# Patient Record
Sex: Female | Born: 1960 | Race: White | Hispanic: No | Marital: Married | Smoking: Never smoker
Health system: Southern US, Community
[De-identification: ages and names within clinical notes are randomized; demographics above are authoritative.]

## PROBLEM LIST (undated history)

## (undated) DIAGNOSIS — E119 Type 2 diabetes mellitus without complications: Secondary | ICD-10-CM

## (undated) DIAGNOSIS — I10 Essential (primary) hypertension: Secondary | ICD-10-CM

## (undated) DIAGNOSIS — M797 Fibromyalgia: Secondary | ICD-10-CM

## (undated) HISTORY — PX: NASAL SINUS SURGERY: SHX719

## (undated) HISTORY — PX: ABDOMINAL HYSTERECTOMY: SHX81

## (undated) HISTORY — PX: SHOULDER SURGERY: SHX246

---

## 2007-05-05 ENCOUNTER — Ambulatory Visit: Payer: Self-pay | Admitting: Internal Medicine

## 2007-06-10 ENCOUNTER — Ambulatory Visit: Payer: Self-pay | Admitting: Internal Medicine

## 2015-12-24 ENCOUNTER — Encounter: Payer: Self-pay | Admitting: *Deleted

## 2015-12-24 ENCOUNTER — Ambulatory Visit (INDEPENDENT_AMBULATORY_CARE_PROVIDER_SITE_OTHER): Payer: BLUE CROSS/BLUE SHIELD

## 2015-12-24 ENCOUNTER — Ambulatory Visit
Admission: EM | Admit: 2015-12-24 | Discharge: 2015-12-24 | Disposition: A | Discharge status: Home / Self Care | Payer: BLUE CROSS/BLUE SHIELD | Attending: Family Medicine | Admitting: Family Medicine

## 2015-12-24 DIAGNOSIS — J209 Acute bronchitis, unspecified: Secondary | ICD-10-CM

## 2015-12-24 DIAGNOSIS — J0101 Acute recurrent maxillary sinusitis: Secondary | ICD-10-CM | POA: Diagnosis not present

## 2015-12-24 DIAGNOSIS — H6593 Unspecified nonsuppurative otitis media, bilateral: Secondary | ICD-10-CM

## 2015-12-24 DIAGNOSIS — E860 Dehydration: Secondary | ICD-10-CM | POA: Diagnosis not present

## 2015-12-24 HISTORY — DX: Fibromyalgia: M79.7

## 2015-12-24 HISTORY — DX: Type 2 diabetes mellitus without complications: E11.9

## 2015-12-24 HISTORY — DX: Essential (primary) hypertension: I10

## 2015-12-24 MED ORDER — AMOXICILLIN-POT CLAVULANATE 875-125 MG PO TABS: 1.0000 | ORAL_TABLET | Freq: Two times a day (BID) | ORAL | Status: AC

## 2015-12-24 MED ORDER — IPRATROPIUM-ALBUTEROL 0.5-2.5 (3) MG/3ML IN SOLN: 3.0000 mL | Freq: Once | RESPIRATORY_TRACT | Status: DC

## 2015-12-24 MED ORDER — SALINE SPRAY 0.65 % NA SOLN: 2.0000 | NASAL | Status: AC

## 2015-12-24 MED ORDER — BENZONATATE 200 MG PO CAPS: 200.0000 mg | ORAL_CAPSULE | Freq: Three times a day (TID) | ORAL | Status: AC | PRN

## 2015-12-24 MED ORDER — ACETAMINOPHEN 500 MG PO TABS: 1000.0000 mg | ORAL_TABLET | Freq: Four times a day (QID) | ORAL | Status: AC | PRN

## 2015-12-24 MED ORDER — IPRATROPIUM-ALBUTEROL 0.5-2.5 (3) MG/3ML IN SOLN: 3.0000 mL | Freq: Four times a day (QID) | RESPIRATORY_TRACT | Status: DC

## 2015-12-24 MED ORDER — FLUTICASONE PROPIONATE 50 MCG/ACT NA SUSP
1.0000 | Freq: Two times a day (BID) | NASAL | Status: AC
Start: 2015-12-24 — End: ?

## 2015-12-24 MED ORDER — ALBUTEROL SULFATE HFA 108 (90 BASE) MCG/ACT IN AERS: 1.0000 | INHALATION_SPRAY | Freq: Four times a day (QID) | RESPIRATORY_TRACT | Status: AC | PRN

## 2015-12-24 NOTE — Discharge Instructions (Signed)
Acute Bronchitis °Bronchitis is inflammation of the airways that extend from the windpipe into the lungs (bronchi). The inflammation often causes mucus to develop. This leads to a cough, which is the most common symptom of bronchitis.  °In acute bronchitis, the condition usually develops suddenly and goes away over time, usually in a couple weeks. Smoking, allergies, and asthma can make bronchitis worse. Repeated episodes of bronchitis may cause further lung problems.  °CAUSES °Acute bronchitis is most often caused by the same virus that causes a cold. The virus can spread from person to person (contagious) through coughing, sneezing, and touching contaminated objects. °SIGNS AND SYMPTOMS  °· Cough.   °· Fever.   °· Coughing up mucus.   °· Body aches.   °· Chest congestion.   °· Chills.   °· Shortness of breath.   °· Sore throat.   °DIAGNOSIS  °Acute bronchitis is usually diagnosed through a physical exam. Your health care provider will also ask you questions about your medical history. Tests, such as chest X-rays, are sometimes done to rule out other conditions.  °TREATMENT  °Acute bronchitis usually goes away in a couple weeks. Oftentimes, no medical treatment is necessary. Medicines are sometimes given for relief of fever or cough. Antibiotic medicines are usually not needed but may be prescribed in certain situations. In some cases, an inhaler may be recommended to help reduce shortness of breath and control the cough. A cool mist vaporizer may also be used to help thin bronchial secretions and make it easier to clear the chest.  °HOME CARE INSTRUCTIONS °· Get plenty of rest.   °· Drink enough fluids to keep your urine clear or pale yellow (unless you have a medical condition that requires fluid restriction). Increasing fluids may help thin your respiratory secretions (sputum) and reduce chest congestion, and it will prevent dehydration.   °· Take medicines only as directed by your health care provider. °· If  you were prescribed an antibiotic medicine, finish it all even if you start to feel better. °· Avoid smoking and secondhand smoke. Exposure to cigarette smoke or irritating chemicals will make bronchitis worse. If you are a smoker, consider using nicotine gum or skin patches to help control withdrawal symptoms. Quitting smoking will help your lungs heal faster.   °· Reduce the chances of another bout of acute bronchitis by washing your hands frequently, avoiding people with cold symptoms, and trying not to touch your hands to your mouth, nose, or eyes.   °· Keep all follow-up visits as directed by your health care provider.   °SEEK MEDICAL CARE IF: °Your symptoms do not improve after 1 week of treatment.  °SEEK IMMEDIATE MEDICAL CARE IF: °· You develop an increased fever or chills.   °· You have chest pain.   °· You have severe shortness of breath. °· You have bloody sputum.   °· You develop dehydration. °· You faint or repeatedly feel like you are going to pass out. °· You develop repeated vomiting. °· You develop a severe headache. °MAKE SURE YOU:  °· Understand these instructions. °· Will watch your condition. °· Will get help right away if you are not doing well or get worse. °  °This information is not intended to replace advice given to you by your health care provider. Make sure you discuss any questions you have with your health care provider. °  °Document Released: 12/19/2004 Document Revised: 12/02/2014 Document Reviewed: 05/04/2013 °Elsevier Interactive Patient Education ©2016 [employer] °Otitis Media With Effusion °Otitis media with effusion is the presence of fluid in the middle ear. This is   a common problem in children, which often follows ear infections. It may be present for weeks or longer after the infection. Unlike an acute ear infection, otitis media with effusion refers only to fluid behind the ear drum and not infection. Children with repeated ear and sinus infections and allergy problems  are the most likely to get otitis media with effusion. CAUSES  The most frequent cause of the fluid buildup is dysfunction of the eustachian tubes. These are the tubes that drain fluid in the ears to the back of the nose (nasopharynx). SYMPTOMS   The main symptom of this condition is hearing loss. As a result, you or your child may:  Listen to the TV at a loud volume.  Not respond to questions.  Ask "what" often when spoken to.  Mistake or confuse one sound or word for another.  There may be a sensation of fullness or pressure but usually not pain. DIAGNOSIS   Your health care provider will diagnose this condition by examining you or your child's ears.  Your health care provider may test the pressure in you or your child's ear with a tympanometer.  A hearing test may be conducted if the problem persists. TREATMENT   Treatment depends on the duration and the effects of the effusion.  Antibiotics, decongestants, nose drops, and cortisone-type drugs (tablets or nasal spray) may not be helpful.  Children with persistent ear effusions may have delayed language or behavioral problems. Children at risk for developmental delays in hearing, learning, and speech may require referral to a specialist earlier than children not at risk.  You or your child's health care provider may suggest a referral to an ear, nose, and throat surgeon for treatment. The following may help restore normal hearing:  Drainage of fluid.  Placement of ear tubes (tympanostomy tubes).  Removal of adenoids (adenoidectomy). HOME CARE INSTRUCTIONS   Avoid secondhand smoke.  Infants who are breastfed are less likely to have this condition.  Avoid feeding infants while they are lying flat.  Avoid known environmental allergens.  Avoid people who are sick. SEEK MEDICAL CARE IF:   Hearing is not better in 3 months.  Hearing is worse.  Ear pain.  Drainage from the ear.  Dizziness. MAKE SURE YOU:    Understand these instructions.  Will watch your condition.  Will get help right away if you are not doing well or get worse.   This information is not intended to replace advice given to you by your health care provider. Make sure you discuss any questions you have with your health care provider.   Document Released: 12/19/2004 Document Revised: 12/02/2014 Document Reviewed: 06/08/2013 Elsevier Interactive Patient Education 2016 [employer] Sinusitis, Adult Sinusitis is redness, soreness, and inflammation of the paranasal sinuses. Paranasal sinuses are air pockets within the bones of your face. They are located beneath your eyes, in the middle of your forehead, and above your eyes. In healthy paranasal sinuses, mucus is able to drain out, and air is able to circulate through them by way of your nose. However, when your paranasal sinuses are inflamed, mucus and air can become trapped. This can allow bacteria and other germs to grow and cause infection. Sinusitis can develop quickly and last only a short time (acute) or continue over a long period (chronic). Sinusitis that lasts for more than 12 weeks is considered chronic. CAUSES Causes of sinusitis include:  Allergies.  Structural abnormalities, such as displacement of the cartilage that separates your nostrils (deviated septum),  which can decrease the air flow through your nose and sinuses and affect sinus drainage.  Functional abnormalities, such as when the small hairs (cilia) that line your sinuses and help remove mucus do not work properly or are not present. SIGNS AND SYMPTOMS Symptoms of acute and chronic sinusitis are the same. The primary symptoms are pain and pressure around the affected sinuses. Other symptoms include:  Upper toothache.  Earache.  Headache.  Bad breath.  Decreased sense of smell and taste.  A cough, which worsens when you are lying flat.  Fatigue.  Fever.  Thick drainage from your nose, which  often is green and may contain pus (purulent).  Swelling and warmth over the affected sinuses. DIAGNOSIS Your health care provider will perform a physical exam. During your exam, your health care provider may perform any of the following to help determine if you have acute sinusitis or chronic sinusitis:  Look in your nose for signs of abnormal growths in your nostrils (nasal polyps).  Tap over the affected sinus to check for signs of infection.  View the inside of your sinuses using an imaging device that has a light attached (endoscope). If your health care provider suspects that you have chronic sinusitis, one or more of the following tests may be recommended:  Allergy tests.  Nasal culture. A sample of mucus is taken from your nose, sent to a lab, and screened for bacteria.  Nasal cytology. A sample of mucus is taken from your nose and examined by your health care provider to determine if your sinusitis is related to an allergy. TREATMENT Most cases of acute sinusitis are related to a viral infection and will resolve on their own within 10 days. Sometimes, medicines are prescribed to help relieve symptoms of both acute and chronic sinusitis. These may include pain medicines, decongestants, nasal steroid sprays, or saline sprays. However, for sinusitis related to a bacterial infection, your health care provider will prescribe antibiotic medicines. These are medicines that will help kill the bacteria causing the infection. Rarely, sinusitis is caused by a fungal infection. In these cases, your health care provider will prescribe antifungal medicine. For some cases of chronic sinusitis, surgery is needed. Generally, these are cases in which sinusitis recurs more than 3 times per year, despite other treatments. HOME CARE INSTRUCTIONS  Drink plenty of water. Water helps thin the mucus so your sinuses can drain more easily.  Use a humidifier.  Inhale steam 3-4 times a day (for example, sit  in the bathroom with the shower running).  Apply a warm, moist washcloth to your face 3-4 times a day, or as directed by your health care provider.  Use saline nasal sprays to help moisten and clean your sinuses.  Take medicines only as directed by your health care provider.  If you were prescribed either an antibiotic or antifungal medicine, finish it all even if you start to feel better. SEEK IMMEDIATE MEDICAL CARE IF:  You have increasing pain or severe headaches.  You have nausea, vomiting, or drowsiness.  You have swelling around your face.  You have vision problems.  You have a stiff neck.  You have difficulty breathing.   This information is not intended to replace advice given to you by your health care provider. Make sure you discuss any questions you have with your health care provider.   Document Released: 11/11/2005 Document Revised: 12/02/2014 Document Reviewed: 11/26/2011 Elsevier Interactive Patient Education 2016 [employer] Dehydration, Adult Dehydration is a condition in which  you do not have enough fluid or water in your body. It happens when you take in less fluid than you lose. Vital organs such as the kidneys, brain, and heart cannot function without a proper amount of fluids. Any loss of fluids from the body can cause dehydration.  Dehydration can range from mild to severe. This condition should be treated right away to help prevent it from becoming severe. CAUSES  This condition may be caused by:  Vomiting.  Diarrhea.  Excessive sweating, such as when exercising in hot or humid weather.  Not drinking enough fluid during strenuous exercise or during an illness.  Excessive urine output.  Fever.  Certain medicines. RISK FACTORS This condition is more likely to develop in:  People who are taking certain medicines that cause the body to lose excess fluid (diuretics).   People who have a chronic illness, such as diabetes, that may increase  urination.  Older adults.   People who live at high altitudes.   People who participate in endurance sports.  SYMPTOMS  Mild Dehydration  Thirst.  Dry lips.  Slightly dry mouth.  Dry, warm skin. Moderate Dehydration  Very dry mouth.   Muscle cramps.   Dark urine and decreased urine production.   Decreased tear production.   Headache.   Light-headedness, especially when you stand up from a sitting position.  Severe Dehydration  Changes in skin.   Cold and clammy skin.   Skin does not spring back quickly when lightly pinched and released.   Changes in body fluids.   Extreme thirst.   No tears.   Not able to sweat when body temperature is high, such as in hot weather.   Minimal urine production.   Changes in vital signs.   Rapid, weak pulse (more than 100 beats per minute when you are sitting still).   Rapid breathing.   Low blood pressure.   Other changes.   Sunken eyes.   Cold hands and feet.   Confusion.  Lethargy and difficulty being awakened.  Fainting (syncope).   Short-term weight loss.   Unconsciousness. DIAGNOSIS  This condition may be diagnosed based on your symptoms. You may also have tests to determine how severe your dehydration is. These tests may include:   Urine tests.   Blood tests.  TREATMENT  Treatment for this condition depends on the severity. Mild or moderate dehydration can often be treated at home. Treatment should be started right away. Do not wait until dehydration becomes severe. Severe dehydration needs to be treated at the hospital. Treatment for Mild Dehydration  Drinking plenty of water to replace the fluid you have lost.   Replacing minerals in your blood (electrolytes) that you may have lost.  Treatment for Moderate Dehydration  Consuming oral rehydration solution (ORS). Treatment for Severe Dehydration  Receiving fluid through an IV tube.   Receiving electrolyte  solution through a feeding tube that is passed through your nose and into your stomach (nasogastric tube or NG tube).  Correcting any abnormalities in electrolytes. HOME CARE INSTRUCTIONS   Drink enough fluid to keep your urine clear or pale yellow.   Drink water or fluid slowly by taking small sips. You can also try sucking on ice cubes.  Have food or beverages that contain electrolytes. Examples include bananas and sports drinks.  Take over-the-counter and prescription medicines only as told by your health care provider.   Prepare ORS according to the manufacturer's instructions. Take sips of ORS every 5 minutes until your  urine returns to normal.  If you have vomiting or diarrhea, continue to try to drink water, ORS, or both.   If you have diarrhea, avoid:   Beverages that contain caffeine.   Fruit juice.   Milk.   Carbonated soft drinks.  Do not take salt tablets. This can lead to the condition of having too much sodium in your body (hypernatremia).  SEEK MEDICAL CARE IF:  You cannot eat or drink without vomiting.  You have had moderate diarrhea during a period of more than 24 hours.  You have a fever. SEEK IMMEDIATE MEDICAL CARE IF:   You have extreme thirst.  You have severe diarrhea.  You have not urinated in 6-8 hours, or you have urinated only a small amount of very dark urine.  You have shriveled skin.  You are dizzy, confused, or both.   This information is not intended to replace advice given to you by your health care provider. Make sure you discuss any questions you have with your health care provider.   Document Released: 11/11/2005 Document Revised: 08/02/2015 Document Reviewed: 03/29/2015 [employer] Interactive Patient Education [employer]

## 2015-12-24 NOTE — ED Notes (Signed)
Pt states that she has a cough and fever that started on Wednesday

## 2015-12-24 NOTE — ED Provider Notes (Signed)
CSN: [id number]     Arrival date & time 12/24/15  1048 History   First MD Initiated Contact with Patient 12/24/15 1137     Chief Complaint  Patient presents with  . Cough  . Fever   (Consider location/radiation/quality/duration/timing/severity/associated sxs/prior Treatment) HPI Comments: Married caucasian female here for evaluation of productive cough brown shortness of breath that started 5 days ago.  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  Diabetic uncontrolled working with endocrinology to improve HgbA1c last 10.5.  Today FBS 257 her usual per patient. T max 102 yesterday has taken nyquil with tylenol and advil po prn.  Patient reported she has not been taking all of her chronic medications due to not feeling well.  Decreased appetite/po intake.  FHX - F-Lymphoma, HTN, CAD  The history is provided by the patient and the spouse.    Past Medical History  Diagnosis Date  . Diabetes mellitus without complication (HCC)   . Hypertension   . Fibromyalgia    Past Surgical History  Procedure Laterality Date  . Abdominal hysterectomy    . Shoulder surgery    . Nasal sinus surgery     History reviewed. No pertinent family history. Social History  Substance Use Topics  . Smoking status: Never Smoker   . Smokeless tobacco: None  . Alcohol Use: No   OB History    No data available     Review of Systems  Constitutional: Positive for fatigue. Negative for fever, chills, diaphoresis, activity change, appetite change and unexpected weight change.  HENT: Positive for congestion, ear pain, postnasal drip and sinus pressure. Negative for dental problem, drooling, ear discharge, facial swelling, hearing loss, mouth sores, nosebleeds, rhinorrhea, sneezing, sore throat, tinnitus, trouble swallowing and voice change.   Eyes: Negative for photophobia, pain, discharge, redness, itching and visual disturbance.  Respiratory: Positive for cough, shortness of breath and wheezing.  Negative for choking, chest tightness and stridor.   Cardiovascular: Negative for chest pain, palpitations and leg swelling.  Gastrointestinal: Negative for nausea, vomiting, abdominal pain, diarrhea, constipation, blood in stool and abdominal distention.  Endocrine: Negative for cold intolerance and heat intolerance.  Genitourinary: Negative for dysuria, hematuria and difficulty urinating.  Musculoskeletal: Negative for myalgias, back pain, joint swelling, arthralgias, gait problem, neck pain and neck stiffness.  Skin: Negative for color change, pallor, rash and wound.  Allergic/Immunologic: Positive for environmental allergies. Negative for food allergies.  Neurological: Positive for headaches. Negative for dizziness, tremors, seizures, syncope, facial asymmetry, speech difficulty, weakness, light-headedness and numbness.  Hematological: Negative for adenopathy. Does not bruise/bleed easily.  Psychiatric/Behavioral: Positive for sleep disturbance. Negative for behavioral problems, confusion and agitation.    Allergies  Azithromycin; Dilaudid; and Strawberry (diagnostic)  Home Medications   Prior to Admission medications   Medication Sig Start Date End Date Taking? Authorizing Provider  amLODipine-benazepril (LOTREL) 10-40 MG capsule Take 1 capsule by mouth daily.   Yes Historical Provider, MD  aspirin 81 MG tablet Take 81 mg by mouth daily.   Yes Historical Provider, MD  atorvastatin (LIPITOR) 80 MG tablet Take 80 mg by mouth daily.   Yes Historical Provider, MD  carvedilol (COREG) 25 MG tablet Take 25 mg by mouth 2 (two) times daily with a meal.   Yes Historical Provider, MD  insulin aspart (NOVOLOG FLEXPEN) 100 UNIT/ML FlexPen Inject 20 Units into the skin 3 (three) times daily with meals.   Yes Historical Provider, MD  insulin detemir (LEVEMIR) 100 UNIT/ML injection Inject 60 Units into  the skin daily.   Yes Historical Provider, MD  metFORMIN (GLUCOPHAGE) 1000 MG tablet Take 2,000 mg  by mouth once.   Yes Historical Provider, MD  rOPINIRole (REQUIP) 4 MG tablet Take 4 mg by mouth 2 (two) times daily.   Yes Historical Provider, MD  torsemide (DEMADEX) 10 MG tablet Take 10 mg by mouth daily.   Yes Historical Provider, MD  acetaminophen (TYLENOL) 500 MG tablet Take 2 tablets (1,000 mg total) by mouth every 6 (six) hours as needed for mild pain, moderate pain, fever or headache. 12/24/15   Barbaraann Barthel, NP  albuterol (PROVENTIL HFA;VENTOLIN HFA) 108 (90 Base) MCG/ACT inhaler Inhale 1-2 puffs into the lungs every 6 (six) hours as needed for wheezing or shortness of breath. 12/24/15 12/30/15  Barbaraann Barthel, NP  amoxicillin-clavulanate (AUGMENTIN) 875-125 MG tablet Take 1 tablet by mouth every 12 (twelve) hours. 12/24/15   Barbaraann Barthel, NP  benzonatate (TESSALON) 200 MG capsule Take 1 capsule (200 mg total) by mouth 3 (three) times daily as needed for cough. 12/24/15 01/03/16  Barbaraann Barthel, NP  fluticasone (FLONASE) 50 MCG/ACT nasal spray Place 1 spray into both nostrils 2 (two) times daily. 12/24/15   Barbaraann Barthel, NP  sodium chloride (OCEAN) 0.65 % SOLN nasal spray Place 2 sprays into both nostrils every 2 (two) hours while awake. 12/24/15   Barbaraann Barthel, NP   Meds Ordered and Administered this Visit   Medications  ipratropium-albuterol (DUONEB) 0.5-2.5 (3) MG/3ML nebulizer solution 3 mL (not administered)    BP 167/91 mmHg  Pulse 88  Temp(Src) 100.1 F (37.8 C) (Oral)  Ht  (1.676 m)  Wt 233 lb (105.688 kg)  BMI 37.63 kg/m2  SpO2 95% No data found.   Physical Exam  Constitutional: She is oriented to person, place, and time. She appears well-developed and well-nourished. She is active and cooperative.  Non-toxic appearance. She does not have a sickly appearance. She appears ill. No distress.  HENT:  Head: Normocephalic and atraumatic.  Right Ear: Hearing, external ear and ear canal normal. Tympanic membrane is injected. A middle ear effusion is  present.  Left Ear: Hearing, external ear and ear canal normal. Tympanic membrane is injected. A middle ear effusion is present.  Nose: Mucosal edema and rhinorrhea present. No nose lacerations, sinus tenderness, nasal deformity, septal deviation or nasal septal hematoma. No epistaxis.  No foreign bodies. Right sinus exhibits maxillary sinus tenderness and frontal sinus tenderness. Left sinus exhibits maxillary sinus tenderness and frontal sinus tenderness.  Mouth/Throat: Uvula is midline and mucous membranes are normal. Mucous membranes are not pale, not dry and not cyanotic. She does not have dentures. No oral lesions. No trismus in the jaw. Normal dentition. No dental abscesses, uvula swelling, lacerations or dental caries. Posterior oropharyngeal edema and posterior oropharyngeal erythema present. No oropharyngeal exudate or tonsillar abscesses.  Cobblestoning posterior pharynx; bilateral TMs with air fluid level central TM injected bilaterally; bilateral nares with edema erythema yellow discharge clear;   Eyes: Conjunctivae, EOM and lids are normal. Pupils are equal, round, and reactive to light. Right eye exhibits no chemosis, no discharge, no exudate and no hordeolum. No foreign body present in the right eye. Left eye exhibits no chemosis, no discharge, no exudate and no hordeolum. No foreign body present in the left eye. Right conjunctiva is not injected. Right conjunctiva has no hemorrhage. Left conjunctiva is not injected. Left conjunctiva has no hemorrhage. No scleral icterus. Right eye exhibits normal extraocular motion  and no nystagmus. Left eye exhibits normal extraocular motion and no nystagmus. Right pupil is round and reactive. Left pupil is round and reactive. Pupils are equal.  Neck: Trachea normal and normal range of motion. Neck supple. No tracheal tenderness, no spinous process tenderness and no muscular tenderness present. No rigidity. No tracheal deviation, no edema, no erythema and  normal range of motion present. No thyroid mass and no thyromegaly present.  Cardiovascular: Normal rate, regular rhythm, S1 normal, S2 normal, normal heart sounds and intact distal pulses.  PMI is not displaced.  Exam reveals no gallop and no friction rub.   No murmur heard. Pulmonary/Chest: Effort normal. No accessory muscle usage or stridor. No respiratory distress. She has decreased breath sounds in the right lower field and the left lower field. She has wheezes in the right middle field and the left middle field. She has no rhonchi. She has no rales. She exhibits no tenderness.  Expiratory wheeze fine on auscultation bilaterally; decreased breath sounds bilateral lower fields; sob with speaking sentences  Abdominal: Soft. She exhibits no shifting dullness, no distension, no pulsatile liver, no fluid wave, no abdominal bruit, no ascites, no pulsatile midline mass and no mass. Bowel sounds are decreased. There is no hepatosplenomegaly. There is no tenderness. There is no rigidity, no rebound, no guarding, no CVA tenderness, no tenderness at McBurney's point and negative Murphy's sign. Hernia confirmed negative in the ventral area.  Dull to percussion x 4 quads  Musculoskeletal: Normal range of motion. She exhibits no edema or tenderness.       Right shoulder: Normal.       Left shoulder: Normal.       Right hip: Normal.       Left hip: Normal.       Right knee: Normal.       Left knee: Normal.       Cervical back: Normal.       Right hand: Normal.       Left hand: Normal.  Lymphadenopathy:       Head (right side): No submental, no submandibular, no tonsillar, no preauricular, no posterior auricular and no occipital adenopathy present.       Head (left side): No submental, no submandibular, no tonsillar, no preauricular, no posterior auricular and no occipital adenopathy present.    She has no cervical adenopathy.       Right cervical: No superficial cervical, no deep cervical and no posterior  cervical adenopathy present.      Left cervical: No superficial cervical, no deep cervical and no posterior cervical adenopathy present.  Neurological: She is alert and oriented to person, place, and time. She has normal strength. She is not disoriented. She displays no atrophy and no tremor. No cranial nerve deficit or sensory deficit. She exhibits normal muscle tone. She displays no seizure activity. Coordination and gait normal. GCS eye subscore is 4. GCS verbal subscore is 5. GCS motor subscore is 6.  Skin: Skin is warm, dry and intact. No abrasion, no bruising, no burn, no ecchymosis, no laceration, no lesion, no petechiae and no rash noted. She is not diaphoretic. No cyanosis or erythema. No pallor. Nails show no clubbing.  Psychiatric: She has a normal mood and affect. Her speech is normal and behavior is normal. Judgment and thought content normal. Cognition and memory are normal.  Nursing note and vitals reviewed.   ED Course  Procedures (including critical care time)  Labs Review Labs Reviewed - No data to  display  Imaging Review Dg Chest 2 View  12/24/2015  CLINICAL DATA:  Five day history of productive cough. EXAM: CHEST  2 VIEW COMPARISON:  None. FINDINGS: Frontal view shows no focal airspace consolidation, pulmonary edema, or pleural effusion. Lateral film shows a nodular density projecting over the seventh and eighth ribs. Cardiopericardial silhouette is at upper limits of normal for size. The visualized bony structures of the thorax are intact. Patient is status post left rotator cuff repair. IMPRESSION: Nodular density projecting over the mid thoracic spinal lateral film may be related to degenerative changes in the spine, but lung lesion cannot be excluded. Consider CT chest without contrast to further evaluate. Electronically Signed   By: Kennith Center M.D.   On: 12/24/2015 12:11    1145 discussed chest xray and duoneb pending.   Start flonase and nasal saline for sinusitis and  if no relief 48 hours may start po antibiotic.  Patient and spouse verbalized understanding of information/instructions, agreed with plan of care.  1204 duoneb 3ml administered by CMA Raynelle Fanning.  1230 Discussed chest xray results with patient-nodule noted but no fluid or pneumonia.  Given copy of xray report.  Sp02 98% on room air s/p duoneb  Expiratory wheeze not every breath now intermittent, rhonchi intermittent bilateral lower lobes intermittent nonproductive cough with deep breaths, improved airflow to bilateral lower lung fields.  Patient reported doxycycline upsets her stomach prefers augmentin for sinus infection.  Has not been taking blood pressure medication.  Tolerated cup of water without nausea/vomiting.  Patient to continue hydrating and take her regular medications upon arrival home. Discussed blood pressure was elevated in clinic today  167/91 goal should be less than 140/90.  patient preferred to follow up with [facility] for chest CT family history of spinal disease/surgeries parents/siblings.  Has appt scheduled 21 Mar.  Patient verbalized understanding of information/instructions, agreed with plan of care and had no further questions at this time.   MDM   1. Acute recurrent maxillary sinusitis   2. Acute bronchitis, unspecified organism   3. Otitis media with effusion, bilateral   4. Mild dehydration     Start flonase 1 spray each nostril BID, saline nasal 2 sprays each nostril q2h prn congestion if no relief with 48 hours of use start augmentin  po BID x 10 days Rx given.  Tylenol  po QID prn pain.  Work excuse 24 hours given to patient.  No evidence of systemic bacterial infection, non toxic and well hydrated.  I do not see where any further testing or imaging is necessary at this time.   I will suggest supportive care, rest, good hygiene and encourage the patient to take adequate fluids.  The patient is to return to clinic or EMERGENCY ROOM if symptoms worsen or change  significantly.  Exitcare handout on sinusitis given to patient.  Patient verbalized agreement and understanding of treatment plan and had no further questions at this time.   P2:  Hand washing and cover cough  Supportive treatment.   No evidence of invasive bacterial infection, non toxic and well hydrated.  This is most likely self limiting viral infection.  I do not see where any further testing or imaging is necessary at this time.   I will suggest supportive care, rest, good hygiene and encourage the patient to take adequate fluids.  The patient is to return to clinic or EMERGENCY ROOM if symptoms worsen or change significantly e.g. ear pain, fever, purulent discharge from ears or bleeding.  Exitcare handout on otitis media with effusion given to patient.  Patient verbalized agreement and understanding of treatment plan.    Discussed postnasal drip has caused airway inflammation patient improved with duoneb Rx for albuterol MDI 1-2 puffs po q4-6h prn sob/wheeze/chest congestion.  Tessalon pearles 200mg  po TID prn cough.  Follow up with [facility] for CT of nodule on mid thoracic spinal lateral film discussed with patient differentials could be lung lesion (cancer), infection, cancer, granuloma, degenerative spine lesion. Bronchitis simple, community acquired, may have started as viral (probably respiratory syncytial, parainfluenza, influenza, or adenovirus), but now evidence of acute purulent bronchitis with resultant bronchial edema and mucus formation.  Viruses are the most common cause of bronchial inflammation in otherwise healthy adults with acute bronchitis.  The appearance of sputum is not predictive of whether a bacterial infection is present.  Purulent sputum is most often caused by viral infections.  There are a small portion of those caused by non-viral agents being Mycoplamsa pneumonia.  Microscopic examination or C&S of sputum in the healthy adult with acute bronchitis is generally not helpful (usually  negative or normal respiratory flora) other considerations being cough from upper respiratory tract infections, sinusitis or allergic syndromes (mild asthma or viral pneumonia).  Differential Diagnosis:  reactive airway disease (asthma, allergic aspergillosis (eosinophilia), chronic bronchitis, respiratory infection (Sinusitis, Common cold, pneumonia), congestive heart failure, reflux esophagitis, bronchogenic tumor, aspiration syndromes and/or exposure irritants/tobacco smoke.  In this case, there is no evidence of any invasive bacterial illness.  Most likely viral etiology so will hold on antibiotic treatment.  Advise supportive care with rest, encourage fluids, good hygiene and watch for any worsening symptoms.  If they were to develop:  come back to the office or go to the emergency room if after hours. Without high fever, severe dyspnea, lack of physical findings or other risk factors, I will hold on CBC at this time.  I discussed that approximately 50% of patients with acute bronchitis have a cough that lasts up to three weeks, and 25% for over a month.  Tylenol, one to two tablets every four hours as needed for fever or myalgias.   No aspirin.  Patient instructed to follow up in one week or sooner if symptoms worsen. Patient verbalized agreement and understanding of treatment plan.  P2:  hand washing and cover cough  Mild dehydration due to decreased appetite/viral infection dry mucous membranes.  Discussed with patient and spouse to push po fluids (water, broth, gatorade, pedialyte, popsicles, ice chips, ginger ale) to ensure urine pale yellow clear, mucous membranes wet/pink no white coating on tongue.  Prolonged dehydration could worsen diabetes blood sugar levels and cause acute renal insufficiency.  Follow up if lethargy, unable to urinate every 8 hours, brown/cola colored urine.  Exitcare handout on dehydration given to patient.  Patient and spouse verbalized understanding of  information/instructions, agreed with plan of care and had no further questions at this time.  Barbaraann Barthel, NP 12/25/15 0911  Barbaraann Barthel, NP 12/25/15 [phone]

## 2016-11-18 IMAGING — CR DG CHEST 2V
2 series · 2 of 2 positions shown · non-contrast
Comparison: None.

CLINICAL DATA: Five day history of productive cough.

EXAM:
CHEST  2 VIEW

[chest pa]
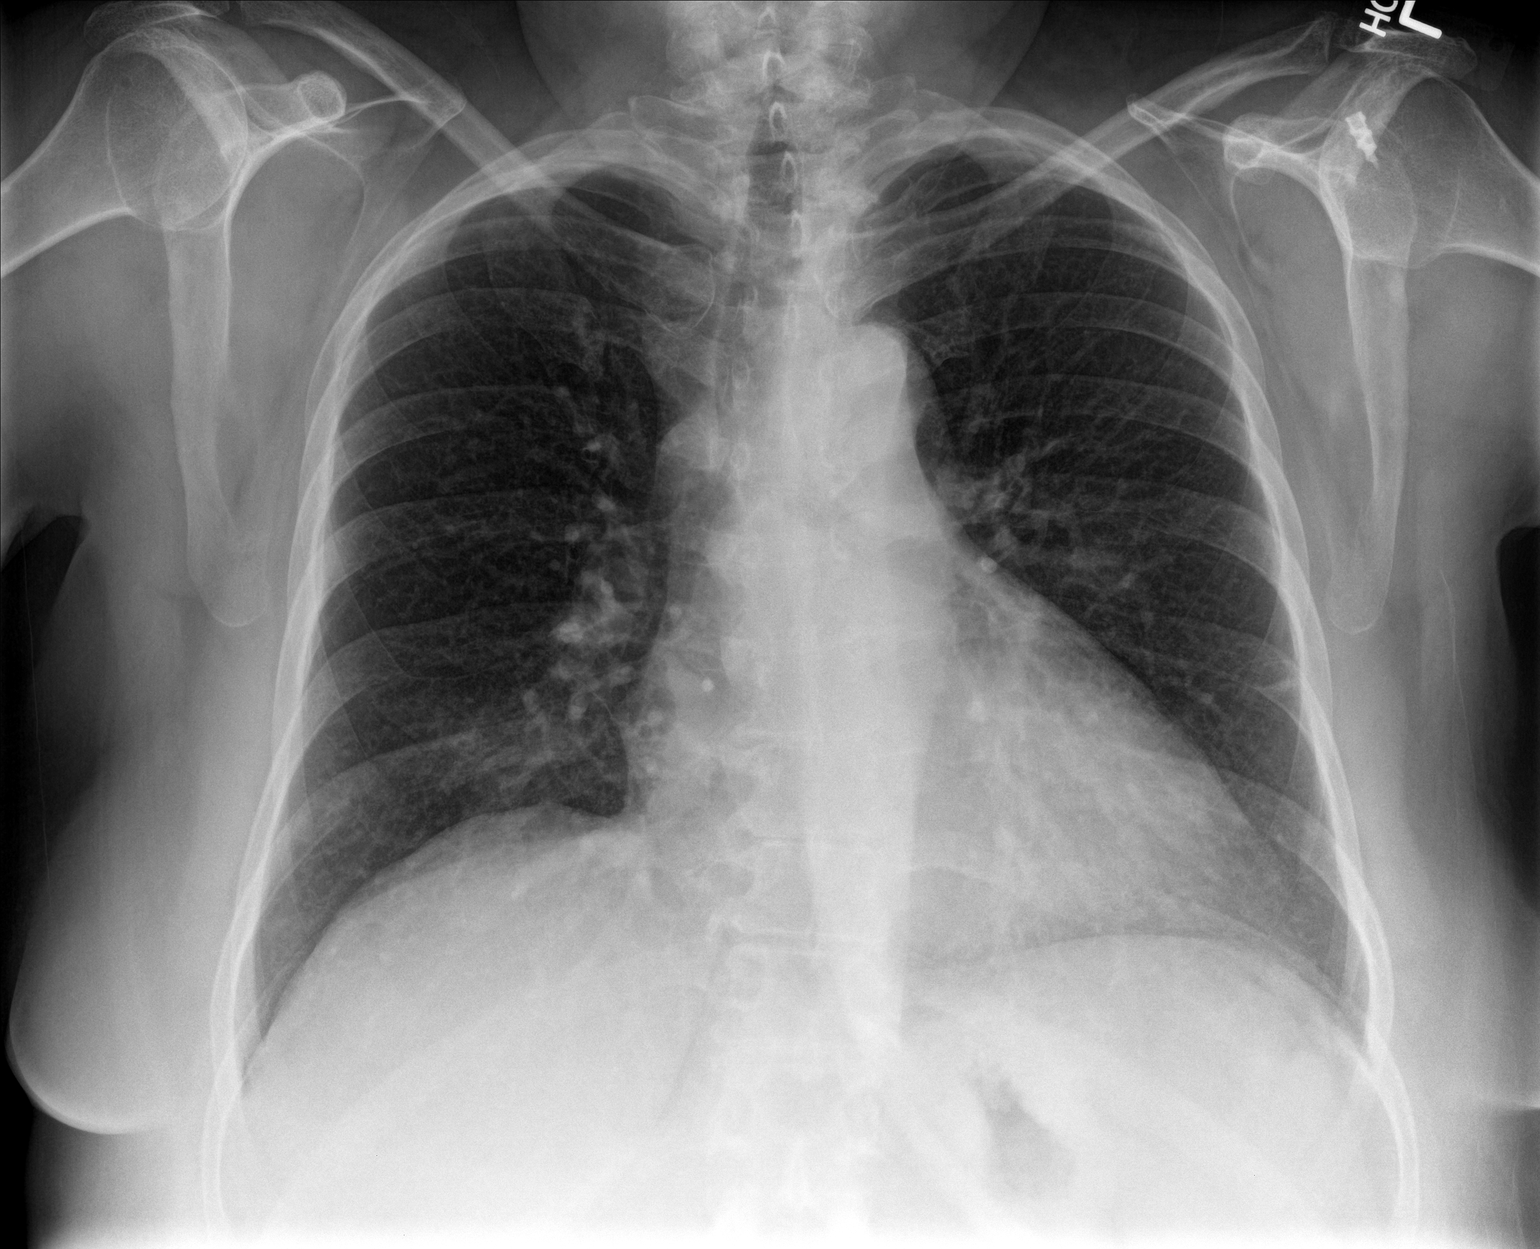

[chest lat]
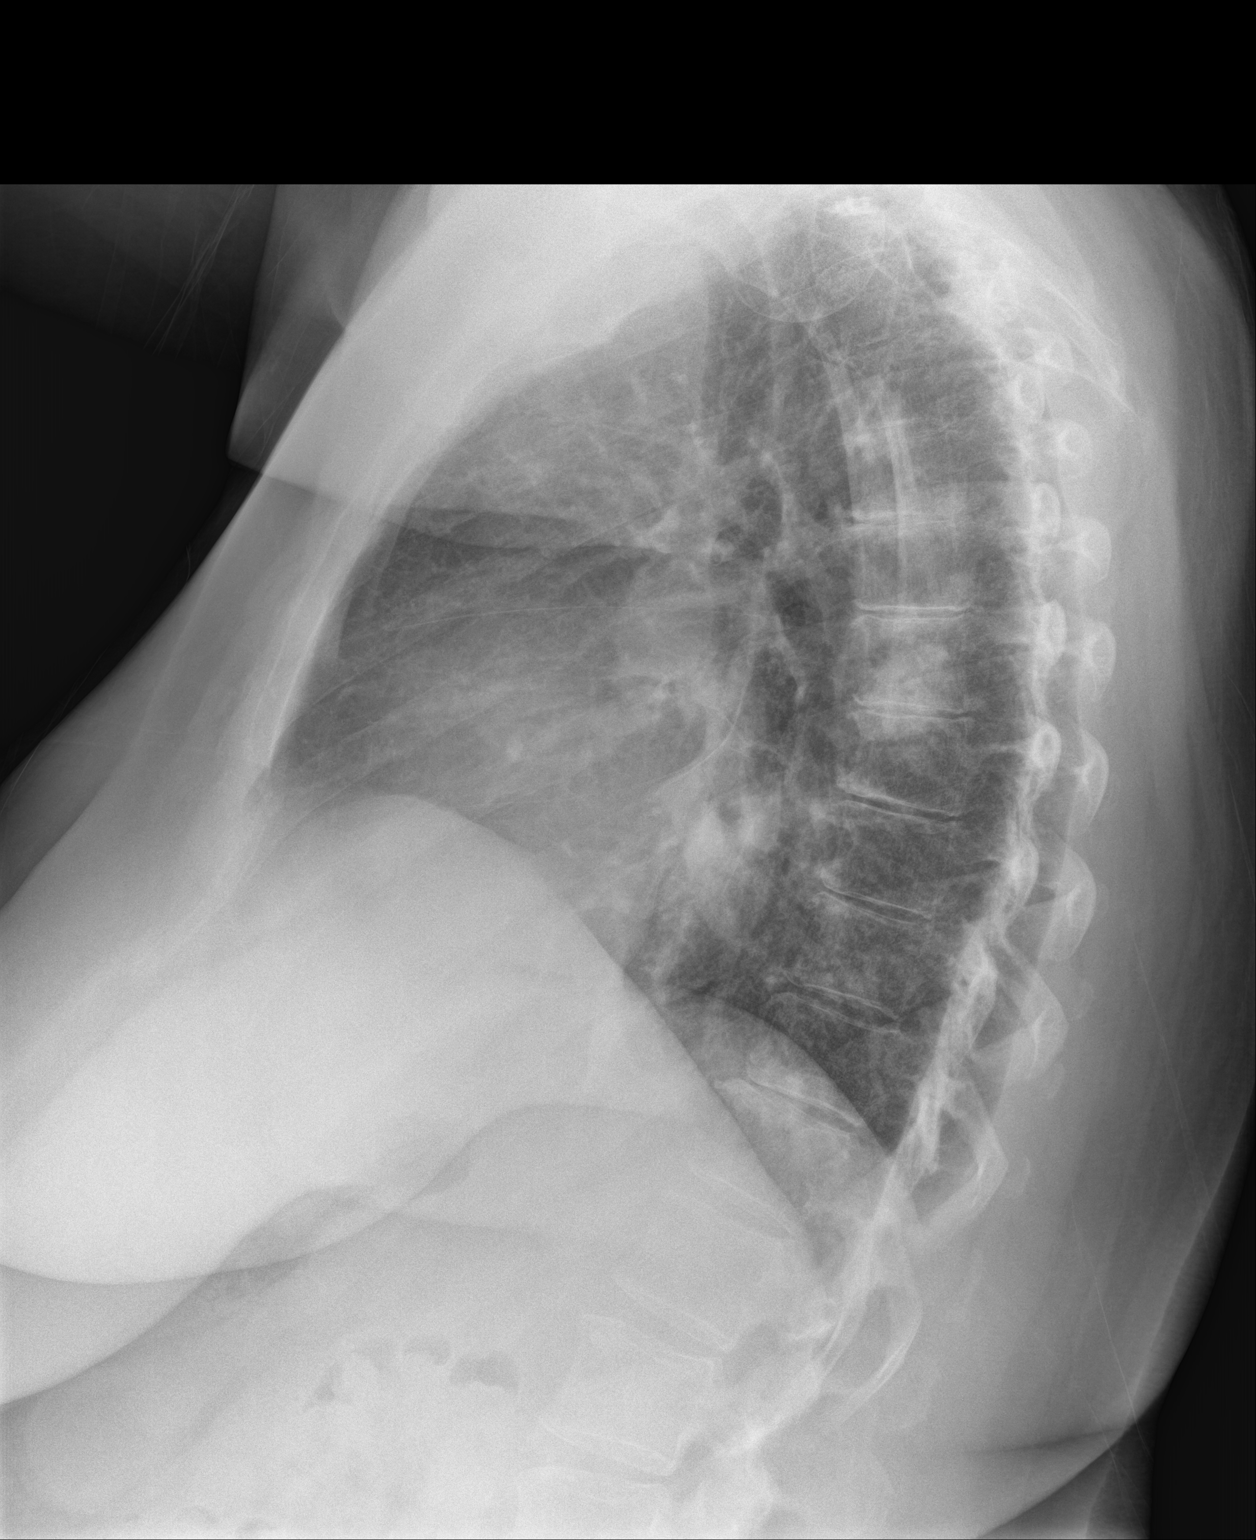

[2 of 2 positions shown; findings below may reference images not displayed]

FINDINGS: Frontal view shows no focal airspace consolidation, pulmonary edema,
or pleural effusion. Lateral film shows a nodular density projecting
over the seventh and eighth ribs. Cardiopericardial silhouette is at
upper limits of normal for size. The visualized bony structures of
the thorax are intact. Patient is status post left rotator cuff
repair.
IMPRESSION: Nodular density projecting over the mid thoracic spinal lateral film
may be related to degenerative changes in the spine, but lung lesion
cannot be excluded. Consider CT chest without contrast to further
evaluate.

## 2020-09-26 ENCOUNTER — Ambulatory Visit (LOCAL_COMMUNITY_HEALTH_CENTER): Payer: BC Managed Care – PPO

## 2020-09-26 ENCOUNTER — Other Ambulatory Visit: Payer: Self-pay

## 2020-09-26 DIAGNOSIS — Z111 Encounter for screening for respiratory tuberculosis: Secondary | ICD-10-CM

## 2020-09-29 ENCOUNTER — Other Ambulatory Visit: Payer: Self-pay

## 2020-09-29 ENCOUNTER — Ambulatory Visit (LOCAL_COMMUNITY_HEALTH_CENTER): Payer: BC Managed Care – PPO

## 2020-09-29 DIAGNOSIS — Z111 Encounter for screening for respiratory tuberculosis: Secondary | ICD-10-CM

## 2020-09-29 LAB — TB SKIN TEST
Induration: 0 mm
TB Skin Test: NEGATIVE
